# Patient Record
Sex: Female | Born: 1950 | Race: Black or African American | Hispanic: No | Marital: Married | Smoking: Never smoker
Health system: Southern US, Community
[De-identification: ages and names within clinical notes are randomized; demographics above are authoritative.]

---

## 2008-12-19 ENCOUNTER — Emergency Department (HOSPITAL_COMMUNITY): Admission: EM | Admit: 2008-12-19 | Discharge: 2008-12-19 | Payer: Self-pay | Admitting: Emergency Medicine

## 2010-07-09 ENCOUNTER — Ambulatory Visit (HOSPITAL_COMMUNITY): Admission: RE | Admit: 2010-07-09 | Discharge: 2010-07-09 | Payer: Self-pay | Admitting: *Deleted

## 2014-04-06 ENCOUNTER — Other Ambulatory Visit: Payer: Self-pay

## 2014-04-06 DIAGNOSIS — Z1231 Encounter for screening mammogram for malignant neoplasm of breast: Secondary | ICD-10-CM

## 2014-04-12 ENCOUNTER — Encounter (INDEPENDENT_AMBULATORY_CARE_PROVIDER_SITE_OTHER): Payer: Self-pay

## 2014-04-12 ENCOUNTER — Ambulatory Visit
Admission: RE | Admit: 2014-04-12 | Discharge: 2014-04-12 | Disposition: A | Discharge status: Home / Self Care | Payer: No Typology Code available for payment source | Source: Ambulatory Visit

## 2014-04-12 DIAGNOSIS — Z1231 Encounter for screening mammogram for malignant neoplasm of breast: Secondary | ICD-10-CM

## 2016-04-23 DIAGNOSIS — Z0100 Encounter for examination of eyes and vision without abnormal findings: Secondary | ICD-10-CM | POA: Diagnosis not present

## 2016-04-23 DIAGNOSIS — H5203 Hypermetropia, bilateral: Secondary | ICD-10-CM | POA: Diagnosis not present

## 2016-04-23 DIAGNOSIS — H524 Presbyopia: Secondary | ICD-10-CM | POA: Diagnosis not present

## 2016-04-23 DIAGNOSIS — H52223 Regular astigmatism, bilateral: Secondary | ICD-10-CM | POA: Diagnosis not present

## 2016-05-16 DIAGNOSIS — Z0101 Encounter for examination of eyes and vision with abnormal findings: Secondary | ICD-10-CM | POA: Diagnosis not present

## 2016-05-16 DIAGNOSIS — Z0100 Encounter for examination of eyes and vision without abnormal findings: Secondary | ICD-10-CM | POA: Diagnosis not present

## 2016-10-26 ENCOUNTER — Ambulatory Visit (HOSPITAL_COMMUNITY)
Admission: EM | Admit: 2016-10-26 | Discharge: 2016-10-26 | Disposition: A | Discharge status: Home / Self Care | Payer: Medicare HMO | Attending: Internal Medicine | Admitting: Internal Medicine

## 2016-10-26 ENCOUNTER — Encounter (HOSPITAL_COMMUNITY): Payer: Self-pay | Admitting: Emergency Medicine

## 2016-10-26 DIAGNOSIS — R42 Dizziness and giddiness: Secondary | ICD-10-CM | POA: Diagnosis not present

## 2016-10-26 DIAGNOSIS — R197 Diarrhea, unspecified: Secondary | ICD-10-CM | POA: Diagnosis not present

## 2016-10-26 DIAGNOSIS — K529 Noninfective gastroenteritis and colitis, unspecified: Secondary | ICD-10-CM | POA: Diagnosis not present

## 2016-10-26 DIAGNOSIS — R109 Unspecified abdominal pain: Secondary | ICD-10-CM | POA: Diagnosis not present

## 2016-10-26 DIAGNOSIS — R61 Generalized hyperhidrosis: Secondary | ICD-10-CM | POA: Diagnosis not present

## 2016-10-26 DIAGNOSIS — R6883 Chills (without fever): Secondary | ICD-10-CM | POA: Diagnosis not present

## 2016-10-26 DIAGNOSIS — N72 Inflammatory disease of cervix uteri: Secondary | ICD-10-CM | POA: Diagnosis not present

## 2016-10-26 LAB — POCT URINALYSIS DIP (DEVICE)
Bilirubin Urine: NEGATIVE
Glucose, UA: NEGATIVE mg/dL
Ketones, ur: NEGATIVE mg/dL
NITRITE: NEGATIVE
PH: 7 (ref 5.0–8.0)
PROTEIN: NEGATIVE mg/dL
Specific Gravity, Urine: 1.01 (ref 1.005–1.030)
UROBILINOGEN UA: 0.2 mg/dL (ref 0.0–1.0)

## 2016-10-26 MED ORDER — ONDANSETRON HCL 4 MG PO TABS: 8.0000 mg | ORAL_TABLET | ORAL | 0 refills | Status: AC | PRN

## 2016-10-26 NOTE — ED Triage Notes (Signed)
Here for poss food poisoning onset 7 days associated w/abd pain, diarrhea, chills, diaphoresis, dizzy  Also c/o vag d/c onset yest  A&O x4... NAD

## 2016-10-26 NOTE — Discharge Instructions (Addendum)
Push fluids.  Eat lightly until nausea subsides and appetite improves.  Bring a stool sample to be tested for food poisoning.  Anticipate gradual improvement in well being over the next several days, with bowel movements returning to a more normal pattern for you over the next couple weeks.  Prescription for ondansetron for nausea was sent to the [facility] on [address].  If pinkish vaginal discharge continues, please followup with gynecology.

## 2016-10-26 NOTE — ED Provider Notes (Signed)
[facility]    CSN: [id number] Arrival date & time: 10/26/16  1503     History   Chief Complaint Chief Complaint  Patient presents with  . Abdominal Pain    HPI Meagan Kramer is a 65 y.o. female. She and a girlfriend ate at a buffet about a week ago, and both of subsequently had GI symptoms. The patient started that evening with severe nausea, chills, sweats, malaise, headachy. Not vomiting, but not really able to eat anything for the next couple days because of the nausea. By Thursday, symptoms were starting to improve, although still poor appetite. Started yesterday with watery diarrhea, crampy abdominal discomfort relieved by BM. Little bit of pink mucous in her underwear, unclear origin. Postmenopausal times years. No dysuria, no urinary frequency.    HPI  History reviewed. No pertinent past medical history.  History reviewed. No pertinent surgical history.   Home Medications   Takes no meds regularly   Family History No family history on file.  Social History Social History  Substance Use Topics  . Smoking status: Never Smoker  . Smokeless tobacco: Never Used  . Alcohol use No     Allergies   Patient has no known allergies.   Review of Systems Review of Systems  All other systems reviewed and are negative.    Physical Exam Triage Vital Signs ED Triage Vitals  Enc Vitals Group     BP 10/26/16 1606 179/91     Pulse Rate 10/26/16 1606 77     Resp 10/26/16 1606 18     Temp 10/26/16 1606 97.9 F (36.6 C)     Temp Source 10/26/16 1606 Oral     SpO2 10/26/16 1606 97 %     Weight --      Height --      Pain Score 10/26/16 1607 6   Updated Vital Signs BP 179/91 (BP Location: Left Arm)   Pulse 77   Temp 97.9 F (36.6 C) (Oral)   Resp 18   SpO2 97%  Physical Exam  Constitutional: She is oriented to person, place, and time. No distress.  Alert, nicely groomed  HENT:  Head: Atraumatic.  Eyes:  Conjugate gaze, no eye redness/drainage   Neck: Neck supple.  Cardiovascular: Normal rate and regular rhythm.   Pulmonary/Chest: No respiratory distress. She has no wheezes. She has no rales.  Lungs clear, symmetric breath sounds  Abdominal: Soft. She exhibits no distension. There is no rebound and no guarding.  Diffuse mild tenderness to palpation, nonfocal  Genitourinary:  Genitourinary Comments: Peri urethral erythema/slight swelling/tenderness, friable cervical/vaginal mucosa.  Discomfort with speculum exam.  Small amount of thin yellowish discharge.    Musculoskeletal: Normal range of motion.  No leg swelling  Neurological: She is alert and oriented to person, place, and time.  Skin: Skin is warm and dry.  No cyanosis  Nursing note and vitals reviewed.    UC Treatments / Results  Labs Results for orders placed or performed during the hospital encounter of 10/26/16  POCT urinalysis dip (device)  Result Value Ref Range   Glucose, UA NEGATIVE NEGATIVE mg/dL   Bilirubin Urine NEGATIVE NEGATIVE   Ketones, ur NEGATIVE NEGATIVE mg/dL   Specific Gravity, Urine 1.010 1.005 - 1.030   Hgb urine dipstick MODERATE (A) NEGATIVE   pH 7.0 5.0 - 8.0   Protein, ur NEGATIVE NEGATIVE mg/dL   Urobilinogen, UA 0.2 0.0 - 1.0 mg/dL   Nitrite NEGATIVE NEGATIVE   Leukocytes, UA SMALL (A)  NEGATIVE  Cervicovaginal ancillary only  Result Value Ref Range   Chlamydia Negative    Neisseria gonorrhea Negative   Cervicovaginal ancillary only  Result Value Ref Range   Wet Prep (BD Affirm) Negative for Candida, Gardnerella, & Trichomonas     Procedures Procedures (including critical care time)   Final Clinical Impressions(s) / UC Diagnoses   Final diagnoses:  Gastroenteritis  Cervicitis   Push fluids.  Eat lightly until nausea subsides and appetite improves.  Bring a stool sample to be tested for food poisoning.  Anticipate gradual improvement in well being over the next several days, with bowel movements returning to a more normal  pattern for you over the next couple weeks.  Prescription for ondansetron for nausea [facility] to [facility] on [address]  If pinkish vaginal discharge continues, please followup with gynecology.  New Prescriptions Discharge Medication List as of 10/26/2016  5:28 PM    START taking these medications   Details  ondansetron (ZOFRAN) 4 MG tablet Take 2 tablets (8 mg total) by mouth every 4 (four) hours as needed for nausea or vomiting., Starting Sat 10/26/2016, Normal         Eustace MooreLaura W Marceil Welp, MD 11/01/16 1826

## 2016-10-28 ENCOUNTER — Telehealth (HOSPITAL_COMMUNITY): Payer: Self-pay | Admitting: Emergency Medicine

## 2016-10-28 LAB — CERVICOVAGINAL ANCILLARY ONLY
Chlamydia: NEGATIVE
NEISSERIA GONORRHEA: NEGATIVE

## 2016-10-28 NOTE — Telephone Encounter (Signed)
[facility]

## 2016-10-29 LAB — GASTROINTESTINAL PANEL BY PCR, STOOL (REPLACES STOOL CULTURE)
ADENOVIRUS F40/41: NOT DETECTED
ASTROVIRUS: NOT DETECTED
CAMPYLOBACTER SPECIES: NOT DETECTED
CRYPTOSPORIDIUM: NOT DETECTED
CYCLOSPORA CAYETANENSIS: NOT DETECTED
ENTAMOEBA HISTOLYTICA: NOT DETECTED
ENTEROPATHOGENIC E COLI (EPEC): NOT DETECTED
Enteroaggregative E coli (EAEC): NOT DETECTED
Enterotoxigenic E coli (ETEC): NOT DETECTED
Giardia lamblia: NOT DETECTED
Norovirus GI/GII: NOT DETECTED
PLESIMONAS SHIGELLOIDES: NOT DETECTED
Rotavirus A: NOT DETECTED
Salmonella species: NOT DETECTED
Sapovirus (I, II, IV, and V): NOT DETECTED
Shiga like toxin producing E coli (STEC): NOT DETECTED
Shigella/Enteroinvasive E coli (EIEC): NOT DETECTED
VIBRIO CHOLERAE: NOT DETECTED
VIBRIO SPECIES: NOT DETECTED
YERSINIA ENTEROCOLITICA: NOT DETECTED

## 2016-10-29 LAB — CERVICOVAGINAL ANCILLARY ONLY: Wet Prep (BD Affirm): NEGATIVE

## 2016-11-01 ENCOUNTER — Telehealth (HOSPITAL_COMMUNITY): Payer: Self-pay | Admitting: Emergency Medicine

## 2016-11-01 NOTE — Telephone Encounter (Signed)
Pt came in to get lab results from visit on 11/13  Copy of lab results given to pt to take home.   Adv pt to f/u if not feeling any better... Pt verb understanding.

## 2017-03-04 DIAGNOSIS — Z0000 Encounter for general adult medical examination without abnormal findings: Secondary | ICD-10-CM | POA: Diagnosis not present

## 2017-03-04 DIAGNOSIS — Z87891 Personal history of nicotine dependence: Secondary | ICD-10-CM | POA: Diagnosis not present

## 2017-03-04 DIAGNOSIS — Z6826 Body mass index (BMI) 26.0-26.9, adult: Secondary | ICD-10-CM | POA: Diagnosis not present

## 2017-04-21 DIAGNOSIS — Z0100 Encounter for examination of eyes and vision without abnormal findings: Secondary | ICD-10-CM | POA: Diagnosis not present

## 2017-05-08 DIAGNOSIS — R293 Abnormal posture: Secondary | ICD-10-CM | POA: Diagnosis not present

## 2017-05-08 DIAGNOSIS — M9902 Segmental and somatic dysfunction of thoracic region: Secondary | ICD-10-CM | POA: Diagnosis not present

## 2017-05-08 DIAGNOSIS — M9901 Segmental and somatic dysfunction of cervical region: Secondary | ICD-10-CM | POA: Diagnosis not present

## 2017-05-08 DIAGNOSIS — M9903 Segmental and somatic dysfunction of lumbar region: Secondary | ICD-10-CM | POA: Diagnosis not present

## 2017-05-08 DIAGNOSIS — M4722 Other spondylosis with radiculopathy, cervical region: Secondary | ICD-10-CM | POA: Diagnosis not present

## 2017-05-15 DIAGNOSIS — M9903 Segmental and somatic dysfunction of lumbar region: Secondary | ICD-10-CM | POA: Diagnosis not present

## 2017-05-15 DIAGNOSIS — M4722 Other spondylosis with radiculopathy, cervical region: Secondary | ICD-10-CM | POA: Diagnosis not present

## 2017-05-15 DIAGNOSIS — R293 Abnormal posture: Secondary | ICD-10-CM | POA: Diagnosis not present

## 2017-05-15 DIAGNOSIS — M9901 Segmental and somatic dysfunction of cervical region: Secondary | ICD-10-CM | POA: Diagnosis not present

## 2017-05-15 DIAGNOSIS — M9902 Segmental and somatic dysfunction of thoracic region: Secondary | ICD-10-CM | POA: Diagnosis not present

## 2017-05-26 DIAGNOSIS — M9901 Segmental and somatic dysfunction of cervical region: Secondary | ICD-10-CM | POA: Diagnosis not present

## 2017-05-26 DIAGNOSIS — M4722 Other spondylosis with radiculopathy, cervical region: Secondary | ICD-10-CM | POA: Diagnosis not present

## 2017-05-26 DIAGNOSIS — R293 Abnormal posture: Secondary | ICD-10-CM | POA: Diagnosis not present

## 2017-05-26 DIAGNOSIS — M9902 Segmental and somatic dysfunction of thoracic region: Secondary | ICD-10-CM | POA: Diagnosis not present

## 2017-05-26 DIAGNOSIS — M9903 Segmental and somatic dysfunction of lumbar region: Secondary | ICD-10-CM | POA: Diagnosis not present

## 2017-05-27 DIAGNOSIS — M4722 Other spondylosis with radiculopathy, cervical region: Secondary | ICD-10-CM | POA: Diagnosis not present

## 2017-05-27 DIAGNOSIS — M9902 Segmental and somatic dysfunction of thoracic region: Secondary | ICD-10-CM | POA: Diagnosis not present

## 2017-05-27 DIAGNOSIS — R293 Abnormal posture: Secondary | ICD-10-CM | POA: Diagnosis not present

## 2017-05-27 DIAGNOSIS — M9903 Segmental and somatic dysfunction of lumbar region: Secondary | ICD-10-CM | POA: Diagnosis not present

## 2017-05-27 DIAGNOSIS — M9901 Segmental and somatic dysfunction of cervical region: Secondary | ICD-10-CM | POA: Diagnosis not present

## 2017-05-28 DIAGNOSIS — M9903 Segmental and somatic dysfunction of lumbar region: Secondary | ICD-10-CM | POA: Diagnosis not present

## 2017-05-28 DIAGNOSIS — M4722 Other spondylosis with radiculopathy, cervical region: Secondary | ICD-10-CM | POA: Diagnosis not present

## 2017-05-28 DIAGNOSIS — M9901 Segmental and somatic dysfunction of cervical region: Secondary | ICD-10-CM | POA: Diagnosis not present

## 2017-05-28 DIAGNOSIS — M9902 Segmental and somatic dysfunction of thoracic region: Secondary | ICD-10-CM | POA: Diagnosis not present

## 2017-05-28 DIAGNOSIS — R293 Abnormal posture: Secondary | ICD-10-CM | POA: Diagnosis not present

## 2017-06-02 DIAGNOSIS — M4722 Other spondylosis with radiculopathy, cervical region: Secondary | ICD-10-CM | POA: Diagnosis not present

## 2017-06-02 DIAGNOSIS — M9901 Segmental and somatic dysfunction of cervical region: Secondary | ICD-10-CM | POA: Diagnosis not present

## 2017-06-02 DIAGNOSIS — M9903 Segmental and somatic dysfunction of lumbar region: Secondary | ICD-10-CM | POA: Diagnosis not present

## 2017-06-02 DIAGNOSIS — M9902 Segmental and somatic dysfunction of thoracic region: Secondary | ICD-10-CM | POA: Diagnosis not present

## 2017-06-02 DIAGNOSIS — R293 Abnormal posture: Secondary | ICD-10-CM | POA: Diagnosis not present

## 2017-06-03 DIAGNOSIS — M9902 Segmental and somatic dysfunction of thoracic region: Secondary | ICD-10-CM | POA: Diagnosis not present

## 2017-06-03 DIAGNOSIS — M4722 Other spondylosis with radiculopathy, cervical region: Secondary | ICD-10-CM | POA: Diagnosis not present

## 2017-06-03 DIAGNOSIS — M9903 Segmental and somatic dysfunction of lumbar region: Secondary | ICD-10-CM | POA: Diagnosis not present

## 2017-06-03 DIAGNOSIS — R293 Abnormal posture: Secondary | ICD-10-CM | POA: Diagnosis not present

## 2017-06-03 DIAGNOSIS — M9901 Segmental and somatic dysfunction of cervical region: Secondary | ICD-10-CM | POA: Diagnosis not present

## 2017-06-16 DIAGNOSIS — Z008 Encounter for other general examination: Secondary | ICD-10-CM | POA: Diagnosis not present

## 2017-06-16 DIAGNOSIS — M4722 Other spondylosis with radiculopathy, cervical region: Secondary | ICD-10-CM | POA: Diagnosis not present

## 2017-06-16 DIAGNOSIS — R293 Abnormal posture: Secondary | ICD-10-CM | POA: Diagnosis not present

## 2017-06-16 DIAGNOSIS — M9903 Segmental and somatic dysfunction of lumbar region: Secondary | ICD-10-CM | POA: Diagnosis not present

## 2017-06-16 DIAGNOSIS — M9901 Segmental and somatic dysfunction of cervical region: Secondary | ICD-10-CM | POA: Diagnosis not present

## 2017-06-16 DIAGNOSIS — M9902 Segmental and somatic dysfunction of thoracic region: Secondary | ICD-10-CM | POA: Diagnosis not present

## 2017-09-25 ENCOUNTER — Other Ambulatory Visit: Payer: Self-pay | Admitting: Chiropractic Medicine

## 2017-09-25 DIAGNOSIS — Z1231 Encounter for screening mammogram for malignant neoplasm of breast: Secondary | ICD-10-CM

## 2017-10-02 ENCOUNTER — Ambulatory Visit
Admission: RE | Admit: 2017-10-02 | Discharge: 2017-10-02 | Disposition: A | Discharge status: Home / Self Care | Payer: Medicare HMO | Source: Ambulatory Visit | Attending: Chiropractic Medicine | Admitting: Chiropractic Medicine

## 2017-10-02 DIAGNOSIS — Z1231 Encounter for screening mammogram for malignant neoplasm of breast: Secondary | ICD-10-CM | POA: Diagnosis not present

## 2018-01-23 DIAGNOSIS — Z0000 Encounter for general adult medical examination without abnormal findings: Secondary | ICD-10-CM | POA: Diagnosis not present

## 2018-05-07 DIAGNOSIS — H52223 Regular astigmatism, bilateral: Secondary | ICD-10-CM | POA: Diagnosis not present

## 2018-05-07 DIAGNOSIS — H524 Presbyopia: Secondary | ICD-10-CM | POA: Diagnosis not present

## 2018-05-07 DIAGNOSIS — H5203 Hypermetropia, bilateral: Secondary | ICD-10-CM | POA: Diagnosis not present

## 2018-05-12 DIAGNOSIS — Z0100 Encounter for examination of eyes and vision without abnormal findings: Secondary | ICD-10-CM | POA: Diagnosis not present

## 2018-07-10 DIAGNOSIS — Z809 Family history of malignant neoplasm, unspecified: Secondary | ICD-10-CM | POA: Diagnosis not present

## 2018-07-10 DIAGNOSIS — Z8249 Family history of ischemic heart disease and other diseases of the circulatory system: Secondary | ICD-10-CM | POA: Diagnosis not present

## 2018-10-13 ENCOUNTER — Other Ambulatory Visit: Payer: Self-pay | Admitting: Chiropractic Medicine

## 2018-10-13 DIAGNOSIS — Z1231 Encounter for screening mammogram for malignant neoplasm of breast: Secondary | ICD-10-CM

## 2018-11-26 ENCOUNTER — Ambulatory Visit: Payer: Medicare HMO

## 2019-09-23 ENCOUNTER — Ambulatory Visit: Payer: Medicare HMO

## 2019-11-15 ENCOUNTER — Other Ambulatory Visit: Payer: Self-pay | Admitting: Chiropractic Medicine

## 2019-11-15 DIAGNOSIS — Z1231 Encounter for screening mammogram for malignant neoplasm of breast: Secondary | ICD-10-CM

## 2019-11-26 ENCOUNTER — Ambulatory Visit: Payer: Medicare HMO

## 2020-01-06 ENCOUNTER — Ambulatory Visit: Payer: Medicare HMO

## 2022-01-04 ENCOUNTER — Other Ambulatory Visit: Payer: Self-pay | Admitting: Family Medicine

## 2022-01-04 DIAGNOSIS — Z1231 Encounter for screening mammogram for malignant neoplasm of breast: Secondary | ICD-10-CM

## 2022-01-04 DIAGNOSIS — Z13820 Encounter for screening for osteoporosis: Secondary | ICD-10-CM

## 2022-01-24 ENCOUNTER — Ambulatory Visit
Admission: RE | Admit: 2022-01-24 | Discharge: 2022-01-24 | Disposition: A | Discharge status: Home / Self Care | Payer: Medicare Other | Source: Ambulatory Visit | Attending: Family Medicine | Admitting: Family Medicine

## 2022-01-24 ENCOUNTER — Other Ambulatory Visit: Payer: Self-pay

## 2022-01-24 DIAGNOSIS — Z13820 Encounter for screening for osteoporosis: Secondary | ICD-10-CM

## 2022-01-24 DIAGNOSIS — Z1231 Encounter for screening mammogram for malignant neoplasm of breast: Secondary | ICD-10-CM

## 2022-12-15 IMAGING — MG MM DIGITAL SCREENING BILAT W/ TOMO AND CAD
8 series · 9 of 24 positions shown · non-contrast
Comparison: Previous exam(s).

CLINICAL DATA: Screening.

EXAM:
DIGITAL SCREENING BILATERAL MAMMOGRAM WITH TOMOSYNTHESIS AND CAD
TECHNIQUE: Bilateral screening digital craniocaudal and mediolateral oblique
mammograms were obtained. Bilateral screening digital breast
tomosynthesis was performed. The images were evaluated with
computer-aided detection.

[L CC synth-2D]
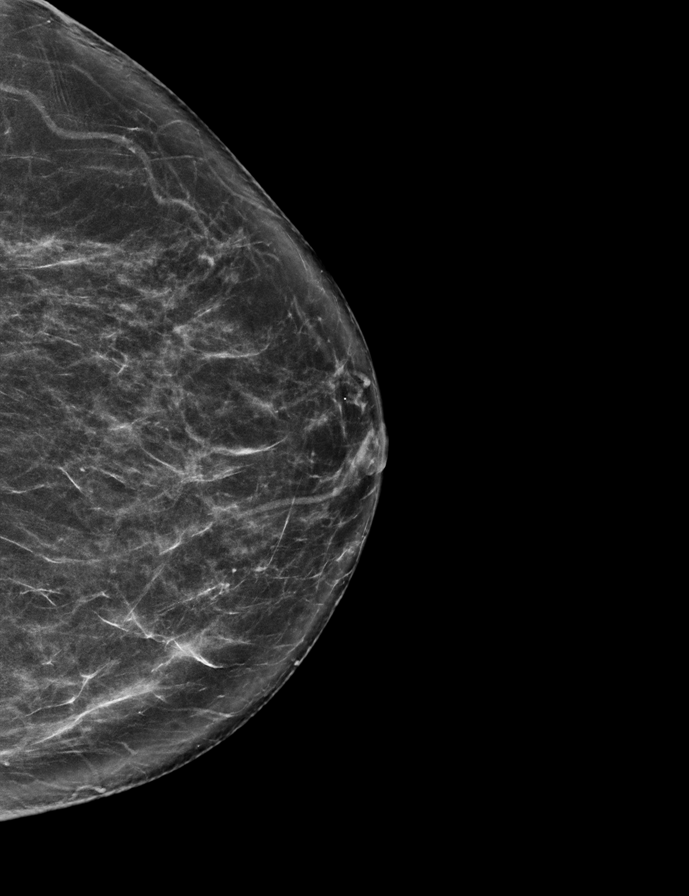

[R CC synth-2D]
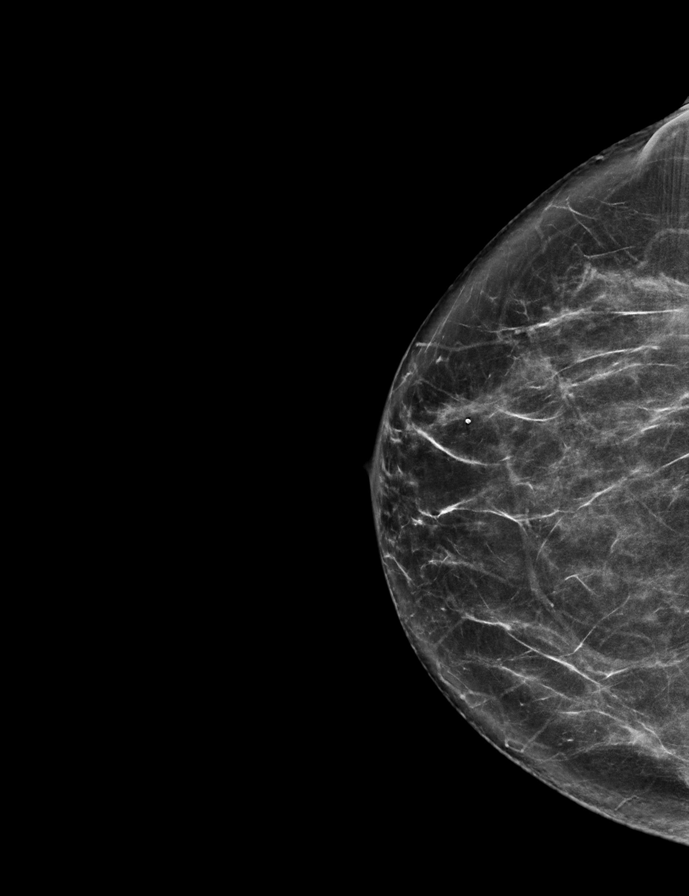

[L MLO synth-2D]
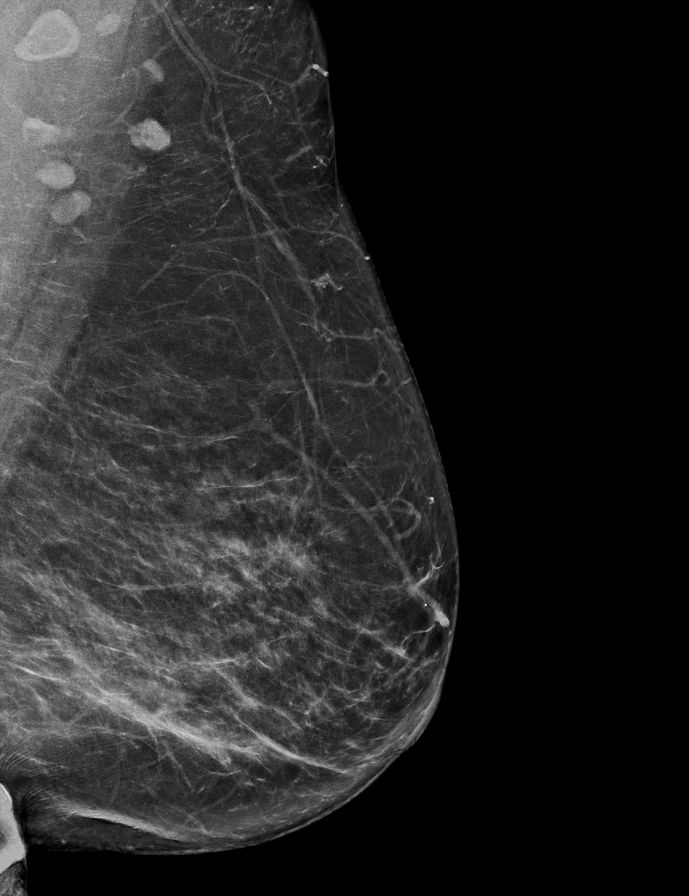

[R MLO synth-2D]
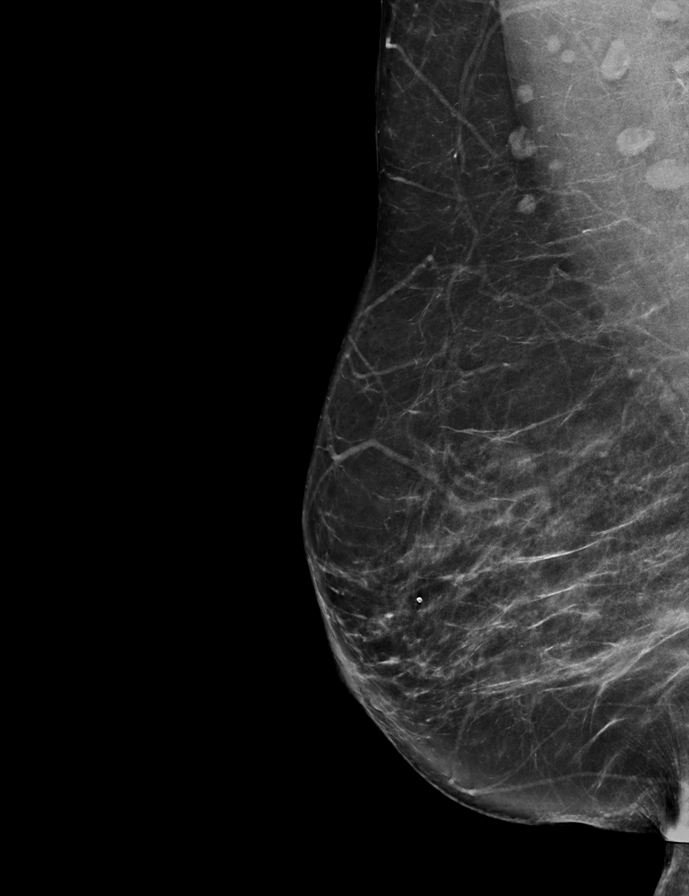

[R CC tomo · 2 of 69 frames shown]
[frame 23/69]
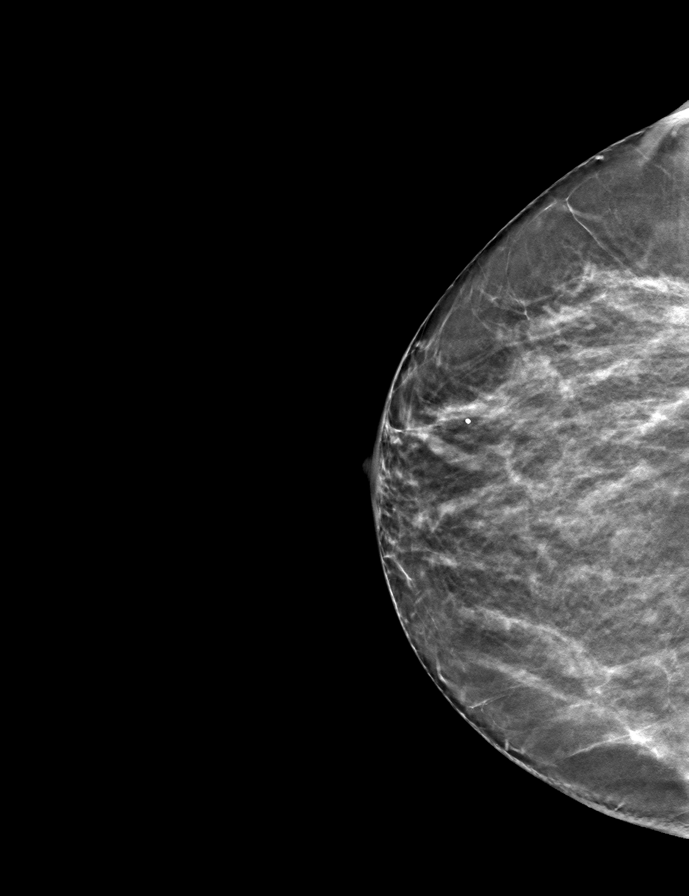
[frame 35/69]
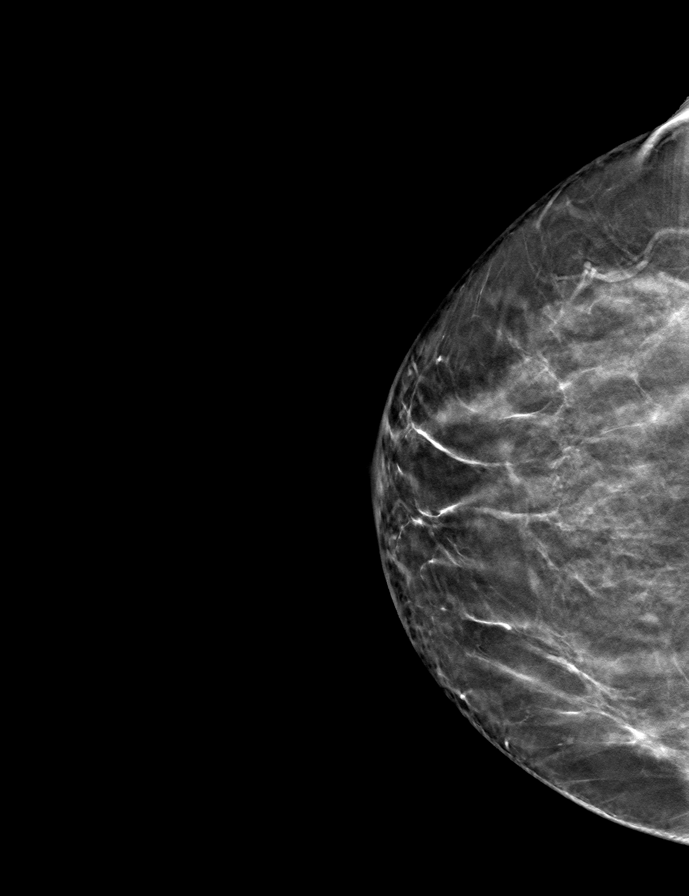

[L CC tomo · tomo slice 37/72.0]
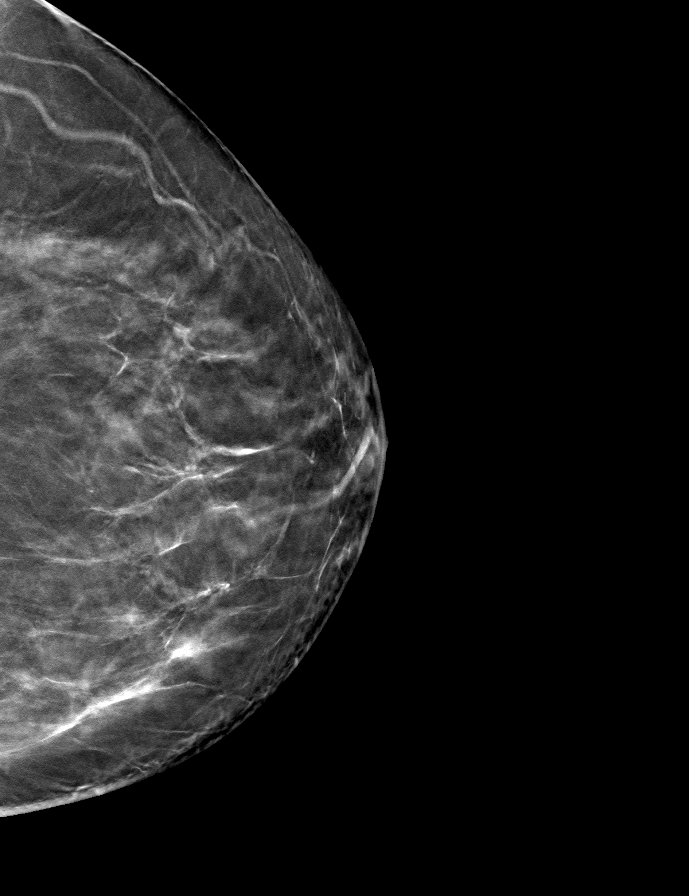

[L MLO tomo · tomo slice 38/75.0]
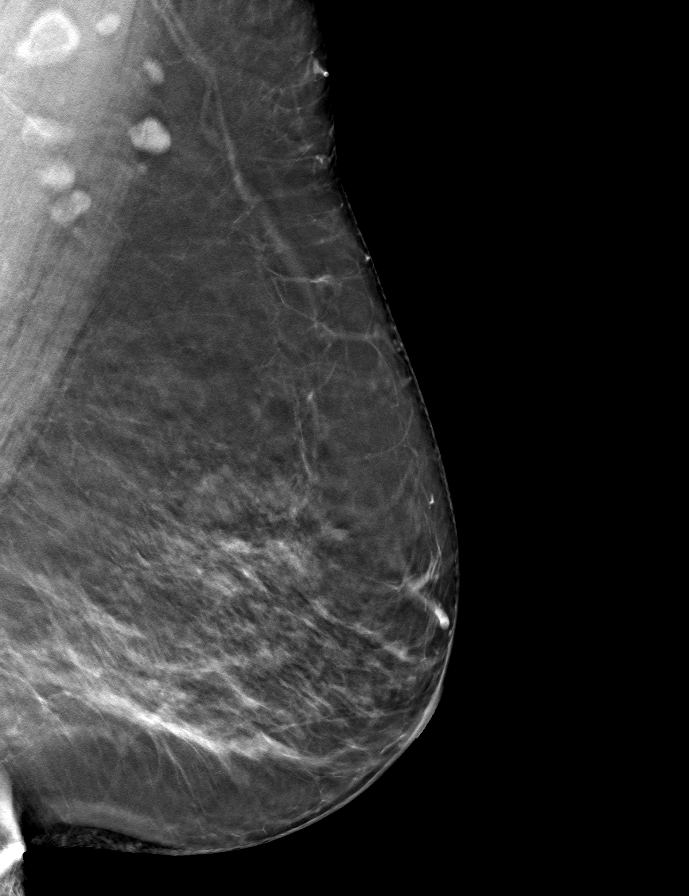

[R MLO tomo · tomo slice 39/76.0]
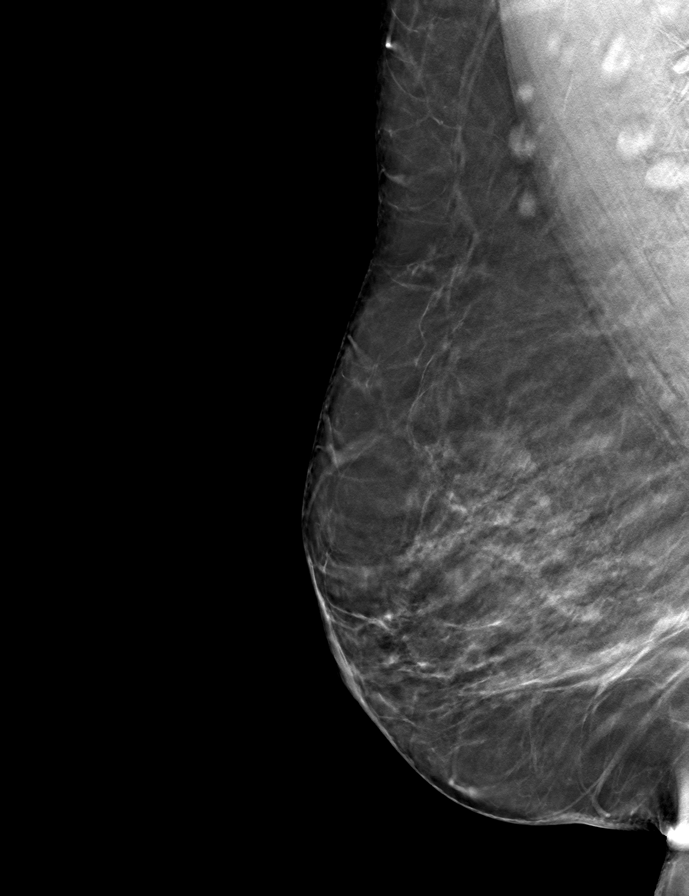

[9 of 24 positions shown; findings below may reference images not displayed]

ACR Breast Density Category b: There are scattered areas of
fibroglandular density.
FINDINGS: There are no findings suspicious for malignancy.
IMPRESSION: No mammographic evidence of malignancy. A result letter of this
screening mammogram will be mailed directly to the patient.

RECOMMENDATION:
Screening mammogram in one year. (Code:51-O-LD2)

BI-RADS CATEGORY  1: Negative.

## 2023-02-05 ENCOUNTER — Other Ambulatory Visit: Payer: Self-pay | Admitting: Physician Assistant

## 2023-02-05 DIAGNOSIS — Z1231 Encounter for screening mammogram for malignant neoplasm of breast: Secondary | ICD-10-CM

## 2023-04-22 ENCOUNTER — Ambulatory Visit
Admission: RE | Admit: 2023-04-22 | Discharge: 2023-04-22 | Disposition: A | Discharge status: Home / Self Care | Payer: Medicare Other | Source: Ambulatory Visit | Attending: Physician Assistant | Admitting: Physician Assistant

## 2023-04-22 DIAGNOSIS — Z1231 Encounter for screening mammogram for malignant neoplasm of breast: Secondary | ICD-10-CM

## 2023-07-14 ENCOUNTER — Other Ambulatory Visit: Payer: Self-pay | Admitting: Physician Assistant

## 2023-07-14 DIAGNOSIS — Z780 Asymptomatic menopausal state: Secondary | ICD-10-CM
